# Patient Record
Sex: Male | Born: 1990 | Race: Black or African American | Hispanic: No | Marital: Single | Smoking: Former smoker
Health system: Southern US, Community
[De-identification: ages and names within clinical notes are randomized; demographics above are authoritative.]

## PROBLEM LIST (undated history)

## (undated) DIAGNOSIS — R011 Cardiac murmur, unspecified: Secondary | ICD-10-CM

---

## 2018-02-28 ENCOUNTER — Ambulatory Visit (HOSPITAL_COMMUNITY)
Admission: EM | Admit: 2018-02-28 | Discharge: 2018-02-28 | Disposition: A | Discharge status: Home / Self Care | Payer: Self-pay | Attending: Family Medicine | Admitting: Family Medicine

## 2018-02-28 ENCOUNTER — Encounter (HOSPITAL_COMMUNITY): Payer: Self-pay | Admitting: Emergency Medicine

## 2018-02-28 ENCOUNTER — Other Ambulatory Visit: Payer: Self-pay

## 2018-02-28 DIAGNOSIS — M779 Enthesopathy, unspecified: Secondary | ICD-10-CM

## 2018-02-28 HISTORY — DX: Cardiac murmur, unspecified: R01.1

## 2018-02-28 MED ORDER — MELOXICAM 7.5 MG PO TABS: 7.5000 mg | ORAL_TABLET | Freq: Every day | ORAL | 0 refills | Status: DC

## 2018-02-28 NOTE — ED Triage Notes (Signed)
Pt reports pain in his right forearm that started two weeks ago.  He states the pain goes all the way up to his shoulder now and he has numbness and tingling in his right fingers.

## 2018-02-28 NOTE — ED Provider Notes (Signed)
[facility]    CSN: [id number] Arrival date & time: 02/28/18  1013     History   Chief Complaint Chief Complaint  Patient presents with  . Arm Problem    right    HPI Dre Gamino is a 27 y.o. male.   27 year old male comes in for 2-week history of right forearm pain.  States this pain is now traveling up to his shoulder.  And gets numbness and tingling to the left third and fourth finger.  Denies injury/trauma.  Denies neck pain.  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 States took ibuprofen  BID yesterday without relief.      Past Medical History:  Diagnosis Date  . Heart murmur     There are no active problems to display for this patient.   History reviewed. No pertinent surgical history.     Home Medications    Prior to Admission medications   Medication Sig Start Date End Date Taking? Authorizing Provider  meloxicam (MOBIC) 7.5 MG tablet Take 1 tablet (7.5 mg total) by mouth daily. 02/28/18   Belinda Fisher, PA-C    Family History Family History  Problem Relation Age of Onset  . Healthy Mother   . Hypertension Father     Social History Social History   Tobacco Use  . Smoking status: Former Games developer  . Smokeless tobacco: Never Used  Substance Use Topics  . Alcohol use: Not Currently  . Drug use: Not Currently     Allergies   Patient has no known allergies.   Review of Systems Review of Systems  Reason unable to perform ROS: See HPI as above.     Physical Exam Triage Vital Signs ED Triage Vitals  Enc Vitals Group     BP 02/28/18 1114 99/84     Pulse Rate 02/28/18 1114 (!) 58     Resp --      Temp 02/28/18 1114 98.2 F (36.8 C)     Temp Source 02/28/18 1114 Oral     SpO2 02/28/18 1114 100 %     Weight --      Height --      Head Circumference --      Peak Flow --      Pain Score 02/28/18 1110 7     Pain Loc --      Pain Edu? --      Excl. in GC? --    No  data found.  Updated Vital Signs BP 99/84 (BP Location: Left Arm)   Pulse (!) 58   Temp 98.2 F (36.8 C) (Oral)   SpO2 100%   Physical Exam  Constitutional: He is oriented to person, place, and time. He appears well-developed and well-nourished. No distress.  HENT:  Head: Normocephalic and atraumatic.  Eyes: Pupils are equal, round, and reactive to light. Conjunctivae are normal.  Neck: Normal range of motion. Neck supple. No spinous process tenderness and no muscular tenderness present. Normal range of motion present.  Cardiovascular: Normal rate, regular rhythm and normal heart sounds. Exam reveals no gallop and no friction rub.  No murmur heard. Pulmonary/Chest: Effort normal and breath sounds normal. He has no wheezes. He has no rales.  Musculoskeletal:  No tenderness on palpation of the spinous processes. No tenderness to palpation of trapezius muscle. Mild tenderness to palpation of anterior right upper arm. Mild tenderness to palpation of right forearm slightly distal to lateral epicondyle.  Full ROM of neck, shoulder, elbow, wrist, finger. Strength normal and equal bilaterally. Sensation intact and equal bilaterally.  Radial pulses 2+ and equal bilaterally. Capillary refill less than 2 seconds.   Negative Tinel's, Phalen's, Finkelstein.  Neurological: He is alert and oriented to person, place, and time.  Skin: Skin is warm and dry.     UC Treatments / Results  Labs (all labs ordered are listed, but only abnormal results are displayed) Labs Reviewed - No data to display  EKG None  Radiology No results found.  Procedures Procedures (including critical care time)  Medications Ordered in UC Medications - No data to display  Initial Impression / Assessment and Plan / UC Course  I have reviewed the triage vital signs and the nursing notes.  Pertinent labs & imaging results that were available during my care of the patient were reviewed by me and considered in my  medical decision making (see chart for details).    Will treat for tendinitis with Mobic.  Ice compress, wrist splint to see if it'll improve intermittent numbness/tingling of the fingers. Return precautions given. Patient expresses understanding and agrees to plan.   Final Clinical Impressions(s) / UC Diagnoses   Final diagnoses:  Tendinitis    ED Prescriptions    Medication Sig Dispense Auth. Provider   meloxicam (MOBIC) 7.5 MG tablet Take 1 tablet (7.5 mg total) by mouth daily. 15 tablet Threasa Alpha, [place] 02/28/18 1158

## 2018-02-28 NOTE — Discharge Instructions (Signed)
Start mobic as directed. Ice compress. This may take a few weeks to resolve, but should be improving each week. Follow up for reevaluation if symptoms not improving.

## 2018-03-23 ENCOUNTER — Telehealth (HOSPITAL_COMMUNITY): Payer: Self-pay | Admitting: Emergency Medicine

## 2018-03-23 MED ORDER — MELOXICAM 7.5 MG PO TABS: 7.5000 mg | ORAL_TABLET | Freq: Every day | ORAL | 0 refills | Status: AC

## 2020-10-07 ENCOUNTER — Encounter (HOSPITAL_COMMUNITY): Payer: Self-pay | Admitting: *Deleted

## 2020-10-07 ENCOUNTER — Emergency Department (HOSPITAL_COMMUNITY)
Admission: EM | Admit: 2020-10-07 | Discharge: 2020-10-07 | Disposition: A | Discharge status: Home / Self Care | Payer: Self-pay | Attending: Emergency Medicine | Admitting: Emergency Medicine

## 2020-10-07 ENCOUNTER — Emergency Department (HOSPITAL_COMMUNITY): Payer: Self-pay

## 2020-10-07 DIAGNOSIS — Y9301 Activity, walking, marching and hiking: Secondary | ICD-10-CM | POA: Insufficient documentation

## 2020-10-07 DIAGNOSIS — Z87891 Personal history of nicotine dependence: Secondary | ICD-10-CM | POA: Insufficient documentation

## 2020-10-07 DIAGNOSIS — H0520 Unspecified exophthalmos: Secondary | ICD-10-CM | POA: Insufficient documentation

## 2020-10-07 DIAGNOSIS — S0292XA Unspecified fracture of facial bones, initial encounter for closed fracture: Secondary | ICD-10-CM | POA: Insufficient documentation
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
## 2020-10-07 NOTE — ED Notes (Signed)
Pt refused blood/lab draw.
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
